# Patient Record
Sex: Female | Born: 1980 | Race: White | Hispanic: No | Smoking: Current every day smoker
Health system: Southern US, Community
[De-identification: ages and names within clinical notes are randomized; demographics above are authoritative.]

## PROBLEM LIST (undated history)

## (undated) DIAGNOSIS — F1910 Other psychoactive substance abuse, uncomplicated: Secondary | ICD-10-CM

## (undated) DIAGNOSIS — C801 Malignant (primary) neoplasm, unspecified: Secondary | ICD-10-CM

## (undated) HISTORY — PX: OTHER SURGICAL HISTORY: SHX169

---

## 2009-08-18 ENCOUNTER — Emergency Department (HOSPITAL_COMMUNITY): Admission: EM | Admit: 2009-08-18 | Discharge: 2009-08-18 | Payer: Self-pay | Admitting: Emergency Medicine

## 2009-09-13 ENCOUNTER — Emergency Department (HOSPITAL_COMMUNITY): Admission: EM | Admit: 2009-09-13 | Discharge: 2009-09-13 | Payer: Self-pay | Admitting: Emergency Medicine

## 2010-02-17 ENCOUNTER — Ambulatory Visit (HOSPITAL_COMMUNITY): Admission: RE | Admit: 2010-02-17 | Discharge: 2010-02-17 | Payer: Self-pay | Admitting: Obstetrics & Gynecology

## 2010-03-23 ENCOUNTER — Emergency Department (HOSPITAL_COMMUNITY): Admission: EM | Admit: 2010-03-23 | Discharge: 2010-03-24 | Payer: Self-pay | Admitting: Emergency Medicine

## 2010-03-25 ENCOUNTER — Emergency Department (HOSPITAL_COMMUNITY): Admission: EM | Admit: 2010-03-25 | Discharge: 2010-03-25 | Payer: Self-pay | Admitting: Emergency Medicine

## 2010-04-13 ENCOUNTER — Emergency Department (HOSPITAL_COMMUNITY): Admission: EM | Admit: 2010-04-13 | Discharge: 2010-04-13 | Payer: Self-pay | Admitting: Emergency Medicine

## 2011-01-17 LAB — DIFFERENTIAL
Basophils Absolute: 0 10*3/uL (ref 0.0–0.1)
Basophils Relative: 0 % (ref 0–1)
Eosinophils Absolute: 0.4 10*3/uL (ref 0.0–0.7)
Eosinophils Relative: 4 % (ref 0–5)
Lymphocytes Relative: 23 % (ref 12–46)
Monocytes Absolute: 0.5 10*3/uL (ref 0.1–1.0)
Monocytes Relative: 6 % (ref 3–12)

## 2011-01-17 LAB — COMPREHENSIVE METABOLIC PANEL
AST: 18 U/L (ref 0–37)
Albumin: 3.3 g/dL — ABNORMAL LOW (ref 3.5–5.2)
CO2: 27 mEq/L (ref 19–32)
Calcium: 8.6 mg/dL (ref 8.4–10.5)
Chloride: 103 mEq/L (ref 96–112)
Creatinine, Ser: 0.5 mg/dL (ref 0.4–1.2)
GFR calc Af Amer: >60 mL/min (ref >60)
GFR calc non Af Amer: >60 mL/min (ref >60)
Sodium: 137 mEq/L (ref 135–145)

## 2011-01-17 LAB — CBC
HCT: 30.8 % — ABNORMAL LOW (ref 36.0–46.0)
Hemoglobin: 10.8 g/dL — ABNORMAL LOW (ref 12.0–15.0)
Platelets: 322 10*3/uL (ref 150–400)
RBC: 3.58 MIL/uL — ABNORMAL LOW (ref 3.87–5.11)

## 2011-01-17 LAB — URINALYSIS, ROUTINE W REFLEX MICROSCOPIC
Bilirubin Urine: NEGATIVE
Hgb urine dipstick: NEGATIVE
Ketones, ur: NEGATIVE mg/dL
Nitrite: NEGATIVE
Protein, ur: NEGATIVE mg/dL
Specific Gravity, Urine: >1.03 — ABNORMAL HIGH (ref 1.005–1.030)
Urobilinogen, UA: 0.2 mg/dL (ref 0.0–1.0)
Urobilinogen, UA: 1 mg/dL (ref 0.0–1.0)
pH: 6.5 (ref 5.0–8.0)

## 2011-01-17 LAB — LIPASE, BLOOD: Lipase: 21 U/L (ref 11–59)

## 2011-01-17 LAB — URINE MICROSCOPIC-ADD ON

## 2011-01-17 LAB — PREGNANCY, URINE: Preg Test, Ur: NEGATIVE

## 2011-01-18 LAB — COMPREHENSIVE METABOLIC PANEL
ALT: 11 U/L (ref 0–35)
Albumin: 3.6 g/dL (ref 3.5–5.2)
Alkaline Phosphatase: 94 U/L (ref 39–117)
BUN: 5 mg/dL — ABNORMAL LOW (ref 6–23)
Calcium: 9.1 mg/dL (ref 8.4–10.5)
Creatinine, Ser: 0.61 mg/dL (ref 0.4–1.2)
GFR calc Af Amer: >60 mL/min (ref >60)
Glucose, Bld: 74 mg/dL (ref 70–99)
Sodium: 132 mEq/L — ABNORMAL LOW (ref 135–145)
Total Protein: 6.7 g/dL (ref 6.0–8.3)

## 2011-01-18 LAB — CBC
Hemoglobin: 12.1 g/dL (ref 12.0–15.0)
MCV: 87.3 fL (ref 78.0–100.0)
Platelets: 290 10*3/uL (ref 150–400)
RBC: 3.95 MIL/uL (ref 3.87–5.11)
RDW: 14.2 % (ref 11.5–15.5)

## 2011-01-18 LAB — URINALYSIS, ROUTINE W REFLEX MICROSCOPIC
Glucose, UA: NEGATIVE mg/dL
Ketones, ur: NEGATIVE mg/dL
Nitrite: NEGATIVE
Specific Gravity, Urine: 1.01 (ref 1.005–1.030)
Urobilinogen, UA: 0.2 mg/dL (ref 0.0–1.0)

## 2011-02-03 LAB — CBC
HCT: 31.6 % — ABNORMAL LOW (ref 36.0–46.0)
Hemoglobin: 11 g/dL — ABNORMAL LOW (ref 12.0–15.0)
MCV: 89 fL (ref 78.0–100.0)
Platelets: 228 10*3/uL (ref 150–400)
RBC: 3.55 MIL/uL — ABNORMAL LOW (ref 3.87–5.11)
RDW: 14.5 % (ref 11.5–15.5)
WBC: 6.9 10*3/uL (ref 4.0–10.5)

## 2011-02-03 LAB — BASIC METABOLIC PANEL
BUN: 8 mg/dL (ref 6–23)
Chloride: 105 mEq/L (ref 96–112)
GFR calc non Af Amer: >60 mL/min (ref >60)
Sodium: 137 mEq/L (ref 135–145)

## 2011-02-03 LAB — DIFFERENTIAL
Lymphocytes Relative: 33 % (ref 12–46)
Lymphs Abs: 2.3 10*3/uL (ref 0.7–4.0)
Monocytes Relative: 6 % (ref 3–12)
Neutro Abs: 3.8 10*3/uL (ref 1.7–7.7)
Neutrophils Relative %: 55 % (ref 43–77)

## 2011-02-03 LAB — URINALYSIS, ROUTINE W REFLEX MICROSCOPIC
Bilirubin Urine: NEGATIVE
Glucose, UA: NEGATIVE mg/dL
Ketones, ur: NEGATIVE mg/dL
Nitrite: NEGATIVE
Urobilinogen, UA: 0.2 mg/dL (ref 0.0–1.0)

## 2011-02-03 LAB — PREGNANCY, URINE: Preg Test, Ur: NEGATIVE

## 2011-04-23 ENCOUNTER — Emergency Department (HOSPITAL_COMMUNITY)
Admission: EM | Admit: 2011-04-23 | Discharge: 2011-04-23 | Disposition: A | Discharge status: Home / Self Care | Payer: Medicaid Other | Attending: Emergency Medicine | Admitting: Emergency Medicine

## 2011-04-23 DIAGNOSIS — J3489 Other specified disorders of nose and nasal sinuses: Secondary | ICD-10-CM | POA: Insufficient documentation

## 2011-04-23 DIAGNOSIS — R32 Unspecified urinary incontinence: Secondary | ICD-10-CM | POA: Insufficient documentation

## 2011-04-23 DIAGNOSIS — F411 Generalized anxiety disorder: Secondary | ICD-10-CM | POA: Insufficient documentation

## 2011-04-23 DIAGNOSIS — F3289 Other specified depressive episodes: Secondary | ICD-10-CM | POA: Insufficient documentation

## 2011-04-23 DIAGNOSIS — R05 Cough: Secondary | ICD-10-CM | POA: Insufficient documentation

## 2011-04-23 DIAGNOSIS — R059 Cough, unspecified: Secondary | ICD-10-CM | POA: Insufficient documentation

## 2011-04-23 DIAGNOSIS — F329 Major depressive disorder, single episode, unspecified: Secondary | ICD-10-CM | POA: Insufficient documentation

## 2011-04-23 DIAGNOSIS — O99891 Other specified diseases and conditions complicating pregnancy: Secondary | ICD-10-CM | POA: Insufficient documentation

## 2013-07-24 ENCOUNTER — Emergency Department (HOSPITAL_COMMUNITY)
Admission: EM | Admit: 2013-07-24 | Discharge: 2013-07-24 | Disposition: A | Discharge status: Home / Self Care | Payer: Medicaid Other | Attending: Emergency Medicine | Admitting: Emergency Medicine

## 2013-07-24 ENCOUNTER — Encounter (HOSPITAL_COMMUNITY): Payer: Self-pay | Admitting: Emergency Medicine

## 2013-07-24 DIAGNOSIS — Z3202 Encounter for pregnancy test, result negative: Secondary | ICD-10-CM | POA: Insufficient documentation

## 2013-07-24 DIAGNOSIS — M542 Cervicalgia: Secondary | ICD-10-CM | POA: Insufficient documentation

## 2013-07-24 DIAGNOSIS — F17200 Nicotine dependence, unspecified, uncomplicated: Secondary | ICD-10-CM | POA: Insufficient documentation

## 2013-07-24 DIAGNOSIS — R591 Generalized enlarged lymph nodes: Secondary | ICD-10-CM

## 2013-07-24 DIAGNOSIS — IMO0001 Reserved for inherently not codable concepts without codable children: Secondary | ICD-10-CM | POA: Insufficient documentation

## 2013-07-24 DIAGNOSIS — R599 Enlarged lymph nodes, unspecified: Secondary | ICD-10-CM | POA: Insufficient documentation

## 2013-07-24 LAB — URINALYSIS, ROUTINE W REFLEX MICROSCOPIC
Bilirubin Urine: NEGATIVE
Glucose, UA: NEGATIVE mg/dL
Ketones, ur: NEGATIVE mg/dL
Leukocytes, UA: NEGATIVE

## 2013-07-24 LAB — POCT PREGNANCY, URINE: Preg Test, Ur: NEGATIVE

## 2013-07-24 MED ORDER — TRAMADOL HCL 50 MG PO TABS
50.0000 mg | ORAL_TABLET | Freq: Once | ORAL | Status: AC
  Administered 2013-07-24: 50 mg via ORAL
  Filled 2013-07-24: qty 1

## 2013-07-24 MED ORDER — DOXYCYCLINE HYCLATE 100 MG PO CAPS: 100.0000 mg | ORAL_CAPSULE | Freq: Two times a day (BID) | ORAL | Status: DC

## 2013-07-24 MED ORDER — TRAMADOL HCL 50 MG PO TABS: 50.0000 mg | ORAL_TABLET | Freq: Four times a day (QID) | ORAL | Status: DC | PRN

## 2013-07-24 MED ORDER — DOXYCYCLINE HYCLATE 100 MG PO TABS
100.0000 mg | ORAL_TABLET | Freq: Once | ORAL | Status: AC
  Administered 2013-07-24: 100 mg via ORAL
  Filled 2013-07-24: qty 1

## 2013-07-24 NOTE — ED Notes (Signed)
Pt c/o knot to the rt neck and rt groin x 1.5 weeks

## 2013-07-24 NOTE — ED Provider Notes (Signed)
CSN: [id number]     Arrival date & time 07/24/13  2119 History   First MD Initiated Contact with Patient 07/24/13 2156     Chief Complaint  Patient presents with  . Groin Pain  . Neck Pain   (Consider location/radiation/quality/duration/timing/severity/associated sxs/prior Treatment) HPI Comments: Wendy Mathews is a 32 y.o. female who presents to the Emergency Department complaining of pain and swollen nodules to the right neck and right groin for 2 weeks. Patient she states she noticed symptoms after having a tattoo to the back of her neck and her right buttock.   She denies redness, abscess, numbness or weakness of the lower extremities, neck stiffness, headache, fever or chills  The history is provided by the patient.    History reviewed. No pertinent past medical history. Past Surgical History  Procedure Laterality Date  . Conilization     History reviewed. No pertinent family history. History  Substance Use Topics  . Smoking status: Current Every Day Smoker    Types: Cigarettes  . Smokeless tobacco: Not on file  . Alcohol Use: No   OB History   Grav Para Term Preterm Abortions TAB SAB Ect Mult Living                 Review of Systems  Constitutional: Negative for fever and chills.  HENT: Positive for neck pain. Negative for facial swelling and neck stiffness.   Genitourinary: Negative for dysuria, frequency, flank pain, vaginal bleeding, vaginal discharge, difficulty urinating and pelvic pain.  Musculoskeletal: Positive for myalgias. Negative for joint swelling and arthralgias.  Skin: Negative for color change, rash and wound.       Two recent tattoos  All other systems reviewed and are negative.    Allergies  Aspirin  Home Medications  No current outpatient prescriptions on file. BP 124/63  Pulse 83  Temp(Src) 98.1 F (36.7 C) (Oral)  Resp 20  Ht 5\' 1"  (1.549 m)  Wt 122 lb (55.339 kg)  BMI 23.06 kg/m2  SpO2 100%  LMP 06/26/2013 Physical Exam   Nursing note and vitals reviewed. Constitutional: She is oriented to person, place, and time. She appears well-developed and well-nourished. No distress.  HENT:  Head: Normocephalic and atraumatic.  Mouth/Throat: Oropharynx is clear and moist.  Neck: Normal range of motion. Neck supple.  New tattoo to the posterior neck. No surrounding erythema or abscesses.  Cardiovascular: Normal rate, regular rhythm, normal heart sounds and intact distal pulses.   No murmur heard. Pulmonary/Chest: Effort normal and breath sounds normal. No respiratory distress.  Abdominal: Soft. She exhibits no distension. There is no tenderness. There is no rebound and no guarding.  Musculoskeletal: She exhibits no edema and no tenderness.  Lymphadenopathy:    She has cervical adenopathy.       Right cervical: Superficial cervical and posterior cervical adenopathy present.       Left cervical: No superficial cervical and no posterior cervical adenopathy present.       Right: Inguinal adenopathy present.       Left: No inguinal adenopathy present.  Neurological: She is alert and oriented to person, place, and time. She exhibits normal muscle tone. Coordination normal.  Skin: Skin is warm. Rash noted. There is erythema.  Large, new tattoo to the entire right buttock.  No papules, abscesses or surrounding erythema. Patient has full range of motion of the right hip. DP pulse is brisk, distal sensation intact.    ED Course  Procedures (including critical care time) Labs  Review Results for orders placed during the hospital encounter of 07/24/13  URINALYSIS, ROUTINE W REFLEX MICROSCOPIC      Result Value Range   Color, Urine YELLOW  YELLOW   APPearance CLEAR  CLEAR   Specific Gravity, Urine 1.025  1.005 - 1.030   pH 6.0  5.0 - 8.0   Glucose, UA NEGATIVE  NEGATIVE mg/dL   Hgb urine dipstick NEGATIVE  NEGATIVE   Bilirubin Urine NEGATIVE  NEGATIVE   Ketones, ur NEGATIVE  NEGATIVE mg/dL   Protein, ur NEGATIVE   NEGATIVE mg/dL   Urobilinogen, UA 0.2  0.0 - 1.0 mg/dL   Nitrite NEGATIVE  NEGATIVE   Leukocytes, UA NEGATIVE  NEGATIVE  POCT PREGNANCY, URINE      Result Value Range   Preg Test, Ur NEGATIVE  NEGATIVE    Imaging Review No results found.  MDM    Vital signs are stable. Patient is well appearing. Ambulatory, no focal neuro deficits on exam no meningeal signs. Right sided lymphadenopathy is likely due to recent tattoos. Patient agrees to antibiotic as directed and close followup with her primary care physician for recheck. She appears stable for discharge at this time.  Caddie Randle L. Telesia Ates, PA-C 07/24/13 2349

## 2013-07-25 NOTE — ED Provider Notes (Signed)
Medical screening examination/treatment/procedure(s) were performed by non-physician practitioner and as supervising physician I was immediately available for consultation/collaboration.   Shaletta Hinostroza L Vyctoria Dickman, MD 07/25/13 1415 

## 2015-02-18 ENCOUNTER — Emergency Department (HOSPITAL_COMMUNITY)
Admission: EM | Admit: 2015-02-18 | Discharge: 2015-02-19 | Disposition: A | Discharge status: Home / Self Care | Payer: Medicaid Other | Attending: Emergency Medicine | Admitting: Emergency Medicine

## 2015-02-18 ENCOUNTER — Encounter (HOSPITAL_COMMUNITY): Payer: Self-pay | Admitting: Emergency Medicine

## 2015-02-18 DIAGNOSIS — Z720 Tobacco use: Secondary | ICD-10-CM | POA: Diagnosis not present

## 2015-02-18 DIAGNOSIS — Z79899 Other long term (current) drug therapy: Secondary | ICD-10-CM | POA: Insufficient documentation

## 2015-02-18 DIAGNOSIS — R21 Rash and other nonspecific skin eruption: Secondary | ICD-10-CM | POA: Diagnosis present

## 2015-02-18 MED ORDER — PREDNISONE 10 MG PO TABS: 10.0000 mg | ORAL_TABLET | Freq: Every day | ORAL | Status: DC

## 2015-02-18 MED ORDER — DIPHENHYDRAMINE HCL 25 MG PO CAPS
50.0000 mg | ORAL_CAPSULE | Freq: Once | ORAL | Status: AC
  Administered 2015-02-18: 50 mg via ORAL
  Filled 2015-02-18: qty 2

## 2015-02-18 MED ORDER — PERMETHRIN 5 % EX CREA: TOPICAL_CREAM | CUTANEOUS | Status: DC

## 2015-02-18 MED ORDER — PREDNISONE 50 MG PO TABS
60.0000 mg | ORAL_TABLET | Freq: Once | ORAL | Status: AC
  Administered 2015-02-18: 60 mg via ORAL
  Filled 2015-02-18 (×2): qty 1

## 2015-02-18 NOTE — ED Notes (Signed)
Patient states she thinks her daughter may have scabies and is now breaking into a rash.

## 2015-02-18 NOTE — Discharge Instructions (Signed)
Your rash may be related to recently changing your soap and detergent. I recommended you use Benadryl 50 mg every 8 hours as needed for itching. We are placing you on steroids. Your rash does not appear to be scabies but given your concern we are discharging him with prescription for permethrin. You will need to wash all of your towels, sheets, clothing in hot water. If your symptoms worsen I recommend you follow-up with her primary care physician.   Rash A rash is a change in the color or texture of your skin. There are many different types of rashes. You may have other problems that accompany your rash. CAUSES   Infections.  Allergic reactions. This can include allergies to pets or foods.  Certain medicines.  Exposure to certain chemicals, soaps, or cosmetics.  Heat.  Exposure to poisonous plants.  Tumors, both cancerous and noncancerous. SYMPTOMS   Redness.  Scaly skin.  Itchy skin.  Dry or cracked skin.  Bumps.  Blisters.  Pain. DIAGNOSIS  Your caregiver may do a physical exam to determine what type of rash you have. A skin sample (biopsy) may be taken and examined under a microscope. TREATMENT  Treatment depends on the type of rash you have. Your caregiver may prescribe certain medicines. For serious conditions, you may need to see a skin doctor (dermatologist). HOME CARE INSTRUCTIONS   Avoid the substance that caused your rash.  Do not scratch your rash. This can cause infection.  You may take cool baths to help stop itching.  Only take over-the-counter or prescription medicines as directed by your caregiver.  Keep all follow-up appointments as directed by your caregiver. SEEK IMMEDIATE MEDICAL CARE IF:  You have increasing pain, swelling, or redness.  You have a fever.  You have new or severe symptoms.  You have body aches, diarrhea, or vomiting.  Your rash is not better after 3 days. MAKE SURE YOU:  Understand these instructions.  Will watch  your condition.  Will get help right away if you are not doing well or get worse. Document Released: 10/07/2002 Document Revised: 01/09/2012 Document Reviewed: 08/01/2011 [facility] Patient Information 2015 [place], [place]. This information is not intended to replace advice given to you by your health care provider. Make sure you discuss any questions you have with your health care provider.   Possible Scabies Scabies are small bugs (mites) that burrow under the skin and cause red bumps and severe itching. These bugs can only be seen with a microscope. Scabies are highly contagious. They can spread easily from person to person by direct contact. They are also spread through sharing clothing or linens that have the scabies mites living in them. It is not unusual for an entire family to become infected through shared towels, clothing, or bedding.  HOME CARE INSTRUCTIONS   Your caregiver may prescribe a cream or lotion to kill the mites. If cream is prescribed, massage the cream into the entire body from the neck to the bottom of both feet. Also massage the cream into the scalp and face if your child is less than 49 year old. Avoid the eyes and mouth. Do not wash your hands after application.  Leave the cream on for 8 to 12 hours. Your child should bathe or shower after the 8 to 12 hour application period. Sometimes it is helpful to apply the cream to your child right before bedtime.  One treatment is usually effective and will eliminate approximately 95% of infestations. For severe cases, your caregiver  may decide to repeat the treatment in 1 week. Everyone in your household should be treated with one application of the cream.  New rashes or burrows should not appear within 24 to 48 hours after successful treatment. However, the itching and rash may last for 2 to 4 weeks after successful treatment. Your caregiver may prescribe a medicine to help with the itching or to help the rash go away more  quickly.  Scabies can live on clothing or linens for up to 3 days. All of your child's recently used clothing, towels, stuffed toys, and bed linens should be washed in hot water and then dried in a dryer for at least 20 minutes on high heat. Items that cannot be washed should be enclosed in a plastic bag for at least 3 days.  To help relieve itching, bathe your child in a cool bath or apply cool washcloths to the affected areas.  Your child may return to school after treatment with the prescribed cream. SEEK MEDICAL CARE IF:   The itching persists longer than 4 weeks after treatment.  The rash spreads or becomes infected. Signs of infection include red blisters or yellow-tan crust. Document Released: 10/17/2005 Document Revised: 01/09/2012 Document Reviewed: 02/25/2009 [facility] Patient Information 2015 [place], [place]. This information is not intended to replace advice given to you by your health care provider. Make sure you discuss any questions you have with your health care provider.

## 2015-02-18 NOTE — ED Provider Notes (Signed)
TIME SEEN: 11:35 PM  CHIEF COMPLAINT: Rash  HPI: Pt is a 34 y.o. female with a significant past medical history who presents emergency department with a rash that started today. States her daughter has had similar symptoms. She reports the rash is pruritic. States she has been using a new soap and laundry detergent recently but also recently acquired a mattress from a friend. Denies any fevers. No other systemic symptoms. No vomiting or diarrhea. No known tick bite. No recent travel.  ROS: See HPI Constitutional: no fever  Eyes: no drainage  ENT: no runny nose   Cardiovascular:  no chest pain  Resp: no SOB  GI: no vomiting GU: no dysuria Integumentary:  rash  Allergy:  hives  Musculoskeletal: no leg swelling  Neurological: no slurred speech ROS otherwise negative  PAST MEDICAL HISTORY/PAST SURGICAL HISTORY:  History reviewed. No pertinent past medical history.  MEDICATIONS:  Prior to Admission medications   Medication Sig Start Date End Date Taking? Authorizing Provider  doxycycline (VIBRAMYCIN) 100 MG capsule Take 1 capsule (100 mg total) by mouth 2 (two) times daily. For 10 days 07/24/13   Tammy Triplett, PA-C  traMADol (ULTRAM) 50 MG tablet Take 1 tablet (50 mg total) by mouth every 6 (six) hours as needed for pain. 07/24/13   Tammy Triplett, PA-C    ALLERGIES:  Allergies  Allergen Reactions  . Aspirin     SOCIAL HISTORY:  History  Substance Use Topics  . Smoking status: Current Every Day Smoker    Types: Cigarettes  . Smokeless tobacco: Not on file  . Alcohol Use: No    FAMILY HISTORY: No family history on file.  EXAM: BP 122/82 mmHg  Pulse 81  Temp(Src) 98.6 F (37 C) (Oral)  Resp 20  Ht 5' (1.524 m)  Wt 113 lb (51.256 kg)  BMI 22.07 kg/m2  SpO2 100%  LMP 02/04/2015 CONSTITUTIONAL: Alert and oriented and responds appropriately to questions. Well-appearing; well-nourished, hydrated, nontoxic HEAD: Normocephalic EYES: Conjunctivae clear, PERRL ENT:  normal nose; no rhinorrhea; moist mucous membranes; pharynx without lesions noted poor dentition, no angioedema, normal phonation NECK: Supple, no meningismus, no LAD  CARD: RRR; S1 and S2 appreciated; no murmurs, no clicks, no rubs, no gallops RESP: Normal chest excursion without splinting or tachypnea; breath sounds clear and equal bilaterally; no wheezes, no rhonchi, no rales, no hypoxia or respiratory distress, speaking full sentences ABD/GI: Normal bowel sounds; non-distended; soft, non-tender, no rebound, no guarding BACK:  The back appears normal and is non-tender to palpation, there is no CVA tenderness EXT: Normal ROM in all joints; non-tender to palpation; no edema; normal capillary refill; no cyanosis    SKIN: Normal color for age and race; warm; patient has multiple scattered macular lesions and some areas that look like hives to her torso and extremities, no lesions in between her digits, no blisters or desquamation, no rash on the palms or soles or mucous membranes, no petechiae or purpura NEURO: Moves all extremities equally PSYCH: The patient's mood and manner are appropriate. Grooming and personal hygiene are appropriate.  MEDICAL DECISION MAKING: Patient here with rash. Rash appears consistent with some type of insect bite - discussed with patient that this could be bedbugs from recently receiving a mattress from a friend. I have lower suspicion that this is scabies but given their concern will discharge with permethrin. Have advised her to use Benadryl over-the-counter for itching. I will also put the patient on prednisone given some of these lesions look like hives.  No other systemic symptoms. Also discussed with patient I recommend that she go back to using the old laundry detergent and soap. No sign of any life-threatening rash on exam. Discussed return precautions. She verbalizes understanding and is comfortable with plan.     [place], DO 02/19/15 [phone]

## 2015-04-26 ENCOUNTER — Emergency Department (HOSPITAL_COMMUNITY)
Admission: EM | Admit: 2015-04-26 | Discharge: 2015-04-26 | Disposition: A | Discharge status: Home / Self Care | Payer: Medicaid Other | Attending: Emergency Medicine | Admitting: Emergency Medicine

## 2015-04-26 ENCOUNTER — Encounter (HOSPITAL_COMMUNITY): Payer: Self-pay | Admitting: Emergency Medicine

## 2015-04-26 DIAGNOSIS — Z7952 Long term (current) use of systemic steroids: Secondary | ICD-10-CM | POA: Insufficient documentation

## 2015-04-26 DIAGNOSIS — R21 Rash and other nonspecific skin eruption: Secondary | ICD-10-CM | POA: Insufficient documentation

## 2015-04-26 DIAGNOSIS — Z792 Long term (current) use of antibiotics: Secondary | ICD-10-CM | POA: Diagnosis not present

## 2015-04-26 DIAGNOSIS — Z720 Tobacco use: Secondary | ICD-10-CM | POA: Diagnosis not present

## 2015-04-26 MED ORDER — PREDNISONE 10 MG PO TABS: 10.0000 mg | ORAL_TABLET | Freq: Every day | ORAL | Status: DC

## 2015-04-26 NOTE — ED Provider Notes (Signed)
TIME SEEN: 8:53 AM   CHIEF COMPLAINT: Rash   HPI: Wendy Mathews is a 34 y.o. female who presents to the Emergency Department complaining of a constant, moderate, pruritic rash to bilateral arms and legs and abdomen onset 2 days. She denies using any new or different hygiene products but states she has gotten a new bed recently. She also reports her daughter has similar symptoms. She notes the prescription for steroids she received in April for the same complaint relieved her rash symptoms 2 months ago. She has used topical anti-itch medication but states this has worsened the rash. No fever.  No other new medications.  She is also requesting a note for work today.    ROS: See HPI Constitutional: no fever  Eyes: no drainage  ENT: no runny nose   Cardiovascular:  no chest pain  Resp: no SOB  GI: no vomiting GU: no dysuria Integumentary: rash  Allergy: no hives  Musculoskeletal: no leg swelling  Neurological: no slurred speech ROS otherwise negative  PAST MEDICAL HISTORY/PAST SURGICAL HISTORY:  History reviewed. No pertinent past medical history.  MEDICATIONS:  Prior to Admission medications   Medication Sig Start Date End Date Taking? Authorizing Provider  doxycycline (VIBRAMYCIN) 100 MG capsule Take 1 capsule (100 mg total) by mouth 2 (two) times daily. For 10 days 07/24/13   Tammy Triplett, PA-C  permethrin (ELIMITE) 5 % cream Apply to affected area once 02/18/15   Asencion Loveday N Terik Haughey, DO  predniSONE (DELTASONE) 10 MG tablet Take 1 tablet (10 mg total) by mouth daily. Take 60 mg for 2 days then 50 mg for 2 days then 40 mg for 2 days then 30 mg for 2 days then 20 mg for 2 days then 10 mg for 2 days. 02/18/15   Jarelle Ates N Oshae Simmering, DO  traMADol (ULTRAM) 50 MG tablet Take 1 tablet (50 mg total) by mouth every 6 (six) hours as needed for pain. 07/24/13   Tammy Triplett, PA-C    ALLERGIES:  Allergies  Allergen Reactions  . Aspirin     SOCIAL HISTORY:  History  Substance Use Topics  .  Smoking status: Current Every Day Smoker -- 1.00 packs/day for 13 years    Types: Cigarettes  . Smokeless tobacco: Never Used  . Alcohol Use: No    FAMILY HISTORY: Family History  Problem Relation Age of Onset  . Diabetes Other     EXAM: BP 122/97 mmHg  Pulse 76  Temp(Src) 98.3 F (36.8 C) (Oral)  Resp 18  Ht 5' (1.524 m)  Wt 111 lb (50.349 kg)  BMI 21.68 kg/m2  SpO2 100%  LMP 04/01/2015 CONSTITUTIONAL: Alert and oriented and responds appropriately to questions. Well-appearing; well-nourished; non toxic, afebrile  HEAD: Normocephalic EYES: Conjunctivae clear, PERRL ENT: normal nose; no rhinorrhea; moist mucous membranes; pharynx without lesions noted NECK: Supple, no meningismus, no LAD  CARD: RRR; S1 and S2 appreciated; no murmurs, no clicks, no rubs, no gallops RESP: Normal chest excursion without splinting or tachypnea; breath sounds clear and equal bilaterally; no wheezes, no rhonchi, no rales, no hypoxia or respiratory distress, speaking full sentences ABD/GI: Normal bowel sounds; non-distended; soft, non-tender, no rebound, no guarding, no peritoneal signs BACK:  The back appears normal and is non-tender to palpation, there is no CVA tenderness EXT: Normal ROM in all joints; non-tender to palpation; no edema; normal capillary refill; no cyanosis, no calf tenderness or swelling    SKIN: Normal color for age and race; warm; diffuse papular rash with  some small areas of surrounding erythema but no induration or fluctuance, no drainage, no hives, no petechiae or purpura, no lesions between the digits, no lesions on the palms or soles or in the mucous membranes, no blisters or desquamation NEURO: Moves all extremities equally, sensation to light touch intact diffusely, cranial nerves II through XII intact PSYCH: The patient's mood and manner are appropriate. Grooming and personal hygiene are appropriate.  MEDICAL DECISION MAKING: Patient here with a rash that appears to be  consistent with insect bites, possible bedbugs or fleas. Nothing to suggest scabies. No signs of a life-threatening rash. She has had similar symptoms in April and reports a steroid taper helped her symptoms significantly. We will provide prednisone prescription today, work note. Have discussed using Benadryl for symptoms. Discussed that patient will need to wash all of her clothes, towels, etc. and may also need a new mattress. Recommended outpatient follow-up with the health department.    I personally performed the services described in this documentation, which was scribed in my presence. The recorded information has been reviewed and is accurate.    [place], DO 04/26/15 [phone]

## 2015-04-26 NOTE — ED Notes (Signed)
MD at bedside. 

## 2015-04-26 NOTE — Discharge Instructions (Signed)
Please use Benadryl 50 mg every 8 hours as needed for itching.   Bedbugs Bedbugs are tiny bugs that live in and around beds. During the day, they hide in mattresses and other places near beds. They come out at night and bite people lying in bed. They need blood to live and grow. Bedbugs can be found in beds anywhere. Usually, they are found in places where many people come and go (hotels, shelters, hospitals). It does not matter whether the place is dirty or clean. Getting bitten by bedbugs rarely causes a medical problem. The biggest problem can be getting rid of them. This often takes the work of a Financial risk analyst. CAUSES  Less use of pesticides. Bedbugs were common before the 1950s. Then, strong pesticides such as DDT nearly wiped them out. Today, these pesticides are not used because they harm the environment and can cause health problems.  More travel. Besides mattresses, bedbugs can also live in clothing and luggage. They can come along as people travel from place to place. Bedbugs are more common in certain parts of the world. When people travel to those areas, the bugs can come home with them.  Presence of birds and bats. Bedbugs often infest birds and bats. If you have these animals in or near your home, bedbugs may infest your house, too. SYMPTOMS It does not hurt to be bitten by a bedbug. You will probably not wake up when you are bitten. Bedbugs usually bite areas of the skin that are not covered. Symptoms may show when you wake up, or they may take a day or more to show up. Symptoms may include:  Small red bumps on the skin. These might be lined up in a row or clustered in a group.  A darker red dot in the middle of red bumps.  Blisters on the skin. There may be swelling and very bad itching. These may be signs of an allergic reaction. This does not happen often. DIAGNOSIS Bedbug bites might look and feel like other types of insect bites. The bugs do not stay on the body like  ticks or lice. They bite, drop off, and crawl away to hide. Your caregiver will probably:  Ask about your symptoms.  Ask about your recent activities and travel.  Check your skin for bedbug bites.  Ask you to check at home for signs of bedbugs. You should look for:  Spots or stains on the bed or nearby. This could be from bedbugs that were crushed or from their eggs or waste.  Bedbugs themselves. They are reddish-brown, oval, and flat. They do not fly. They are about the size of an apple seed.  Places to look for bedbugs include:  Beds. Check mattresses, headboards, box springs, and bed frames.  On drapes and curtains near the bed.  Under carpeting in the bedroom.  Behind electrical outlets.  Behind any wallpaper that is peeling.  Inside luggage. TREATMENT Most bedbug bites do not need treatment. They usually go away on their own in a few days. The bites are not dangerous. However, treatment may be needed if you have scratched so much that your skin has become infected. You may also need treatment if you are allergic to bedbug bites. Treatment options include:  A drug that stops swelling and itching (corticosteroid). Usually, a cream is rubbed on the skin. If you have a bad rash, you may be given a corticosteroid pill.  Oral antihistamines. These are pills to help control itching.  Antibiotic medicines. An antibiotic may be prescribed for infected skin. HOME CARE INSTRUCTIONS   Take any medicine prescribed by your caregiver for your bites. Follow the directions carefully.  Consider wearing pajamas with long sleeves and pant legs.  Your bedroom may need to be treated. A pest control expert should make sure the bedbugs are gone. You may need to throw away mattresses or luggage. Ask the pest control expert what you can do to keep the bedbugs from coming back. Common suggestions include:  Putting a plastic cover over your mattress.  Washing and drying your clothes and bedding  in hot water and a hot dryer. The temperature should be hotter than 120 F (48.9 C). Bedbugs are killed by high temperatures.  Vacuuming carefully all around your bed. Vacuum in all cracks and crevices where the bugs might hide. Do this often.  Carefully checking all used furniture, bedding, or clothes that you bring into your house.  Eliminating bird nests and bat roosts.  If you get bedbug bites when traveling, check all your possessions carefully before bringing them into your house. If you find any bugs on clothes or in your luggage, consider throwing those items away. SEEK MEDICAL CARE IF:  You have red bug bites that keep coming back.  You have red bug bites that itch badly.  You have bug bites that cause a skin rash.  You have scratch marks that are red and sore. SEEK IMMEDIATE MEDICAL CARE IF: You have a fever. Document Released: 11/19/2010 Document Revised: 01/09/2012 Document Reviewed: 11/19/2010 [facility] Patient Information 2015 [place], [place]. This information is not intended to replace advice given to you by your health care provider. Make sure you discuss any questions you have with your health care provider.   Rash A rash is a change in the color or texture of your skin. There are many different types of rashes. You may have other problems that accompany your rash. CAUSES   Infections.  Allergic reactions. This can include allergies to pets or foods.  Certain medicines.  Exposure to certain chemicals, soaps, or cosmetics.  Heat.  Exposure to poisonous plants.  Tumors, both cancerous and noncancerous. SYMPTOMS   Redness.  Scaly skin.  Itchy skin.  Dry or cracked skin.  Bumps.  Blisters.  Pain. DIAGNOSIS  Your caregiver may do a physical exam to determine what type of rash you have. A skin sample (biopsy) may be taken and examined under a microscope. TREATMENT  Treatment depends on the type of rash you have. Your caregiver may prescribe  certain medicines. For serious conditions, you may need to see a skin doctor (dermatologist). HOME CARE INSTRUCTIONS   Avoid the substance that caused your rash.  Do not scratch your rash. This can cause infection.  You may take cool baths to help stop itching.  Only take over-the-counter or prescription medicines as directed by your caregiver.  Keep all follow-up appointments as directed by your caregiver. SEEK IMMEDIATE MEDICAL CARE IF:  You have increasing pain, swelling, or redness.  You have a fever.  You have new or severe symptoms.  You have body aches, diarrhea, or vomiting.  Your rash is not better after 3 days. MAKE SURE YOU:  Understand these instructions.  Will watch your condition.  Will get help right away if you are not doing well or get worse. Document Released: 10/07/2002 Document Revised: 01/09/2012 Document Reviewed: 08/01/2011 [facility] Patient Information 2015 [place], [place]. This information is not intended to replace advice given to you  by your health care provider. Make sure you discuss any questions you have with your health care provider. ° °

## 2015-04-26 NOTE — ED Notes (Signed)
Patient c/o rash to arms, leg, and abd that started 2 days ago. Per patient rash itches. Patient reports using anti-itch for mosquito bites and beg bugs but states that made it worse.

## 2015-12-06 ENCOUNTER — Encounter (HOSPITAL_COMMUNITY): Payer: Self-pay | Admitting: Emergency Medicine

## 2015-12-06 ENCOUNTER — Emergency Department (HOSPITAL_COMMUNITY)
Admission: EM | Admit: 2015-12-06 | Discharge: 2015-12-06 | Disposition: A | Discharge status: Home / Self Care | Payer: Medicaid Other | Attending: Emergency Medicine | Admitting: Emergency Medicine

## 2015-12-06 ENCOUNTER — Emergency Department (HOSPITAL_COMMUNITY): Payer: Medicaid Other

## 2015-12-06 DIAGNOSIS — R059 Cough, unspecified: Secondary | ICD-10-CM

## 2015-12-06 DIAGNOSIS — Z8541 Personal history of malignant neoplasm of cervix uteri: Secondary | ICD-10-CM | POA: Insufficient documentation

## 2015-12-06 DIAGNOSIS — R05 Cough: Secondary | ICD-10-CM | POA: Diagnosis present

## 2015-12-06 DIAGNOSIS — J069 Acute upper respiratory infection, unspecified: Secondary | ICD-10-CM | POA: Diagnosis not present

## 2015-12-06 DIAGNOSIS — Z8659 Personal history of other mental and behavioral disorders: Secondary | ICD-10-CM | POA: Insufficient documentation

## 2015-12-06 DIAGNOSIS — F17210 Nicotine dependence, cigarettes, uncomplicated: Secondary | ICD-10-CM | POA: Insufficient documentation

## 2015-12-06 DIAGNOSIS — Z79899 Other long term (current) drug therapy: Secondary | ICD-10-CM | POA: Diagnosis not present

## 2015-12-06 HISTORY — DX: Malignant (primary) neoplasm, unspecified: C80.1

## 2015-12-06 HISTORY — DX: Other psychoactive substance abuse, uncomplicated: F19.10

## 2015-12-06 MED ORDER — PREDNISONE 20 MG PO TABS: 40.0000 mg | ORAL_TABLET | Freq: Every day | ORAL | Status: DC

## 2015-12-06 MED ORDER — ALBUTEROL SULFATE HFA 108 (90 BASE) MCG/ACT IN AERS: 2.0000 | INHALATION_SPRAY | Freq: Four times a day (QID) | RESPIRATORY_TRACT | Status: AC

## 2015-12-06 NOTE — Discharge Instructions (Signed)

## 2015-12-06 NOTE — ED Notes (Signed)
PT c/o nasal congestion, congested non-productive cough and fever x4 days.

## 2015-12-06 NOTE — ED Provider Notes (Signed)
CSN: [id number]     Arrival date & time 12/06/15  1644 History  By signing my name below, I, Wendy Mathews, attest that this documentation has been prepared under the direction and in the presence of Vern Guerette, PA-C. Electronically Signed: Starleen Mathews ED Scribe. 12/06/2015. 5:31 PM.    Chief Complaint  Patient presents with  . Cough   The history is provided by the patient. No language interpreter was used.   HPI Comments: Wendy Mathews is a 35 y.o. female with no chronic conditions who presents to the Emergency Department complaining of an intermittent fever Tmax of 102.4 onset 4 days ago.  Associated symptoms include nasal congestion, dry cough, and chest tightness.  The patient has used Nyquil and Dayquil with some fever improvement (last dose 1 hour ago).  She denies short of breath, chest pain, abdominal pain, dysuria, vomiting or diarrhea.  Past Medical History  Diagnosis Date  . Substance abuse   . Cancer [facility])     cervical cancer   Past Surgical History  Procedure Laterality Date  . Conilization     Family History  Problem Relation Age of Onset  . Diabetes Other    Social History  Substance Use Topics  . Smoking status: Current Every Day Smoker -- 0.50 packs/day for 13 years    Types: Cigarettes  . Smokeless tobacco: Never Used  . Alcohol Use: No   OB History    Gravida Para Term Preterm AB TAB SAB Ectopic Multiple Living   6 6 6       6      Review of Systems  Constitutional: Positive for fever. Negative for chills, activity change and appetite change.  HENT: Positive for congestion, rhinorrhea and sore throat. Negative for facial swelling and trouble swallowing.   Eyes: Negative for visual disturbance.  Respiratory: Positive for cough. Negative for shortness of breath, wheezing and stridor.   Gastrointestinal: Negative for nausea and vomiting.  Musculoskeletal: Negative for neck pain and neck stiffness.  Skin: Negative.   Neurological: Negative for  dizziness, weakness, numbness and headaches.  Hematological: Negative for adenopathy.  Psychiatric/Behavioral: Negative for confusion.  All other systems reviewed and are negative.     Allergies  Aspirin  Home Medications   Prior to Admission medications   Medication Sig Start Date End Date Taking? Authorizing Provider  gabapentin (NEURONTIN) 300 MG capsule Take 300 mg by mouth 3 (three) times daily.   Yes Historical Provider, MD  Pseudoeph-Doxylamine-DM-APAP (DAYQUIL/NYQUIL COLD/FLU RELIEF PO) Take 1-2 capsules by mouth every 4 (four) hours as needed (for cold and cough symptoms).   Yes Historical Provider, MD   BP 110/95 mmHg  Pulse 93  Temp(Src) 98.4 F (36.9 C) (Oral)  Resp 16  Ht 5\' 1"  (1.549 m)  Wt 120 lb (54.432 kg)  BMI 22.69 kg/m2  SpO2 100%  LMP 11/10/2015 Physical Exam  Constitutional: She is oriented to person, place, and time. She appears well-developed and well-nourished. No distress.  HENT:  Head: Normocephalic and atraumatic.  Oropharynx was erythematous.  No edema or exudate.   Eyes: Conjunctivae and EOM are normal.  Neck: Neck supple. No tracheal deviation present.  Cardiovascular: Normal rate.   Pulmonary/Chest: Effort normal. No respiratory distress.  Coarse lung sounds bilaterally.  No wheezing or rales.    Musculoskeletal: Normal range of motion.  Neurological: She is alert and oriented to person, place, and time.  Skin: Skin is warm and dry.  Psychiatric: She has a normal mood and affect.  Her behavior is normal.  Nursing note and vitals reviewed.   ED Course  Procedures (including critical care time)  DIAGNOSTIC STUDIES: Oxygen Saturation is 100% on RA, normal by my interpretation.    COORDINATION OF CARE:  5:47 PM Awaiting results of CXR.  Patient acknowledges and agrees with plan.    Labs Review Labs Reviewed - No data to display  Imaging Review Dg Chest 2 View  12/06/2015  CLINICAL DATA:  Cough and fever 4 days EXAM: CHEST  2 VIEW  COMPARISON:  09/13/2009 FINDINGS: The heart size and mediastinal contours are within normal limits. Both lungs are clear. The visualized skeletal structures are unremarkable. IMPRESSION: No active cardiopulmonary disease. Electronically Signed   By: Kathreen Devoid   On: 12/06/2015 17:48    I have personally reviewed and evaluated these images and lab results as part of my medical decision-making.   EKG Interpretation None      MDM   Final diagnoses:  Cough  URI (upper respiratory infection)    Pt is well appearing.  Non-toxic.  Vitals stable.  CXR reassuring.  Albuterol MDI dispensed, rx for prednisone. Sx's likely viral  I personally performed the services described in this documentation, which was scribed in my presence. The recorded information has been reviewed and is accurate.'   Kem Parkinson, PA-C 12/08/15 2214  Julianne Rice, MD 12/12/15 2246

## 2016-02-28 IMAGING — DX DG CHEST 2V
2 series · 2 of 2 positions shown · non-contrast
Comparison: 09/13/2009

CLINICAL DATA: Cough and fever 4 days

EXAM:
CHEST  2 VIEW

[chest pa]
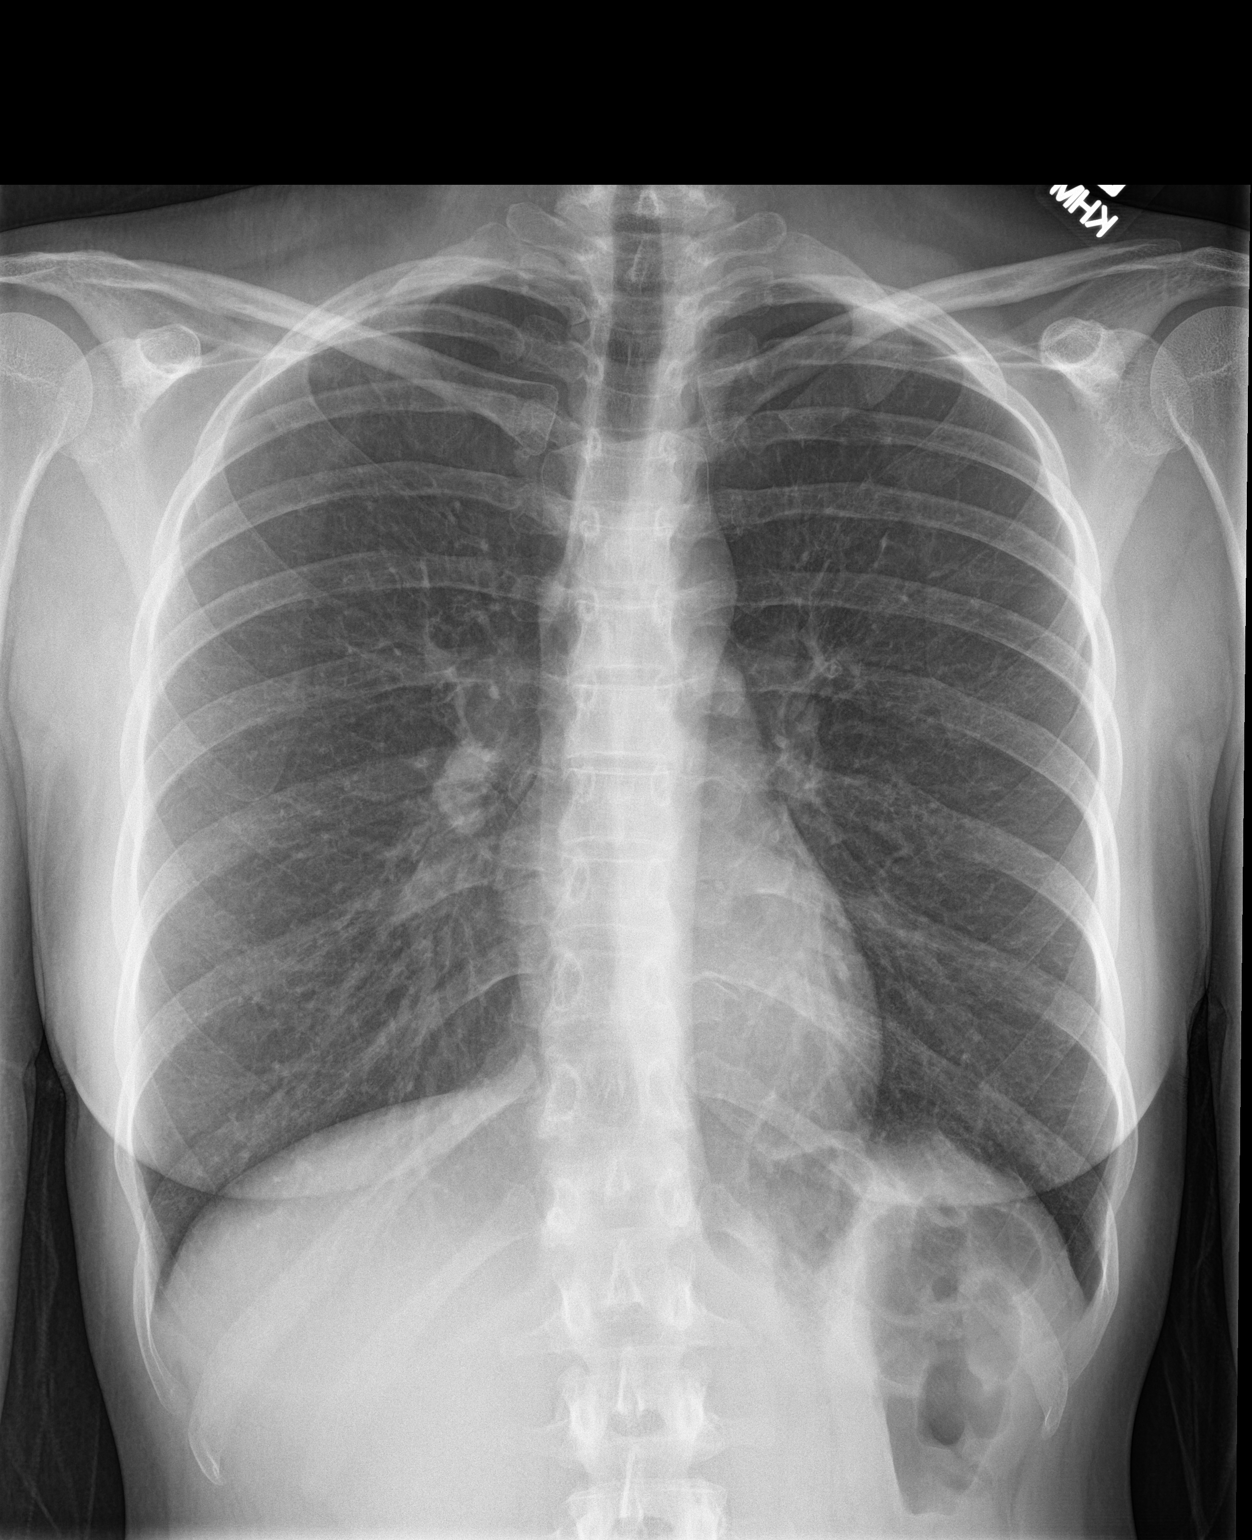

[chest lat]
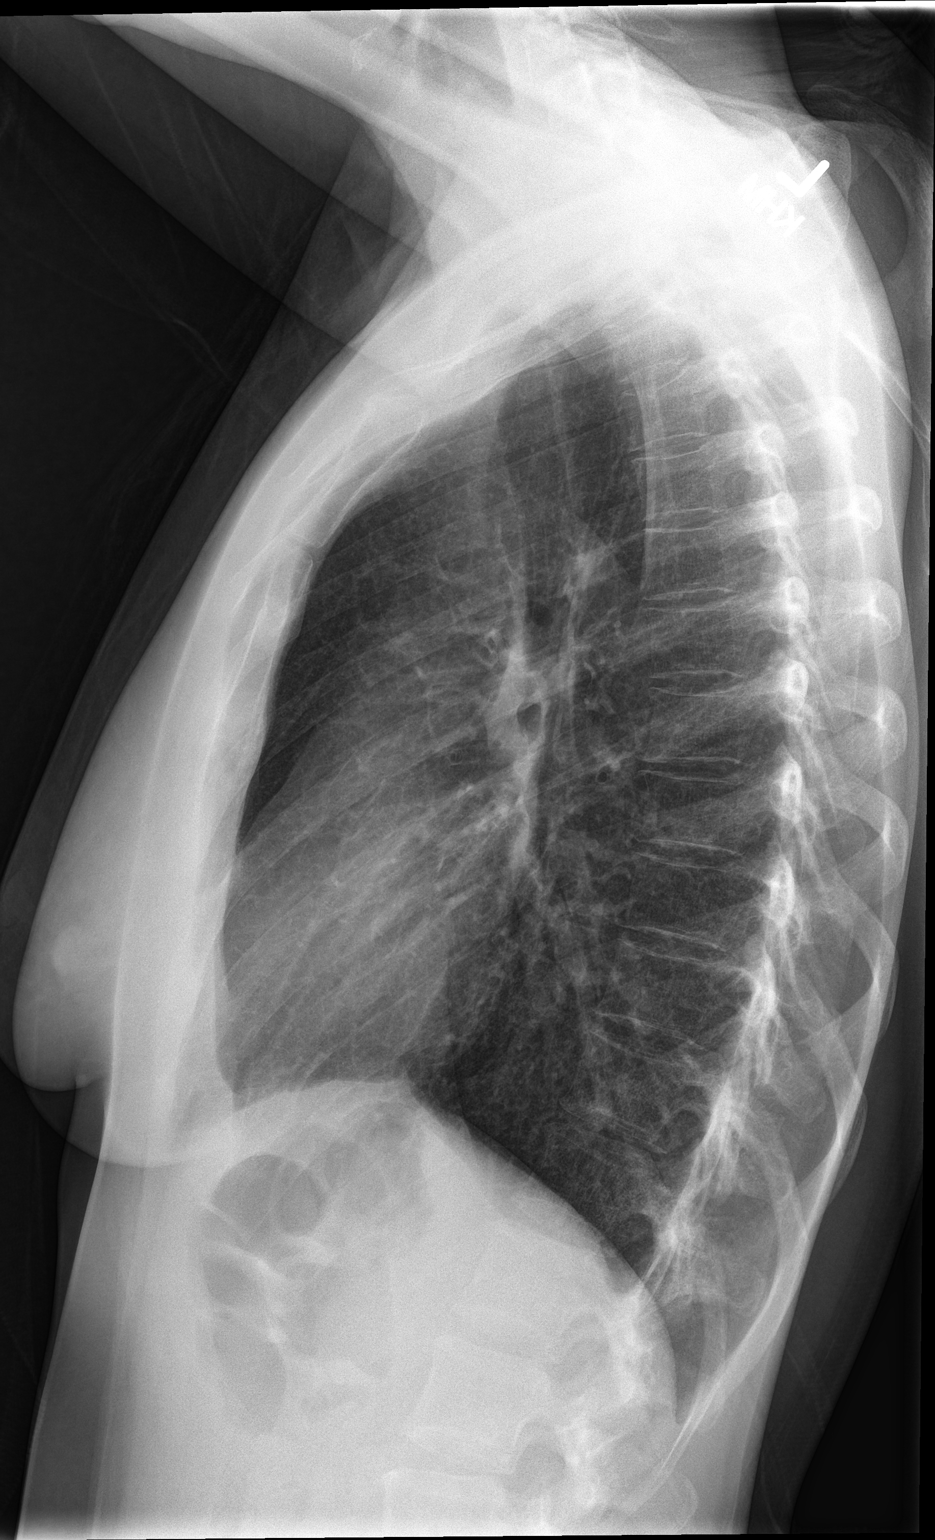

[2 of 2 positions shown; findings below may reference images not displayed]

FINDINGS: The heart size and mediastinal contours are within normal limits.
Both lungs are clear. The visualized skeletal structures are
unremarkable.
IMPRESSION: No active cardiopulmonary disease.

## 2019-09-29 ENCOUNTER — Encounter (HOSPITAL_COMMUNITY): Payer: Self-pay

## 2019-09-29 ENCOUNTER — Other Ambulatory Visit: Payer: Self-pay

## 2019-09-29 ENCOUNTER — Emergency Department (HOSPITAL_COMMUNITY)
Admission: EM | Admit: 2019-09-29 | Discharge: 2019-09-29 | Disposition: A | Discharge status: Home / Self Care | Payer: Medicaid Other | Attending: Emergency Medicine | Admitting: Emergency Medicine

## 2019-09-29 DIAGNOSIS — F17210 Nicotine dependence, cigarettes, uncomplicated: Secondary | ICD-10-CM | POA: Insufficient documentation

## 2019-09-29 DIAGNOSIS — N760 Acute vaginitis: Secondary | ICD-10-CM | POA: Insufficient documentation

## 2019-09-29 DIAGNOSIS — Z8541 Personal history of malignant neoplasm of cervix uteri: Secondary | ICD-10-CM | POA: Insufficient documentation

## 2019-09-29 DIAGNOSIS — N739 Female pelvic inflammatory disease, unspecified: Secondary | ICD-10-CM | POA: Insufficient documentation

## 2019-09-29 DIAGNOSIS — R102 Pelvic and perineal pain: Secondary | ICD-10-CM

## 2019-09-29 DIAGNOSIS — B9689 Other specified bacterial agents as the cause of diseases classified elsewhere: Secondary | ICD-10-CM

## 2019-09-29 DIAGNOSIS — N730 Acute parametritis and pelvic cellulitis: Secondary | ICD-10-CM

## 2019-09-29 LAB — URINALYSIS, ROUTINE W REFLEX MICROSCOPIC
Bilirubin Urine: NEGATIVE
Glucose, UA: NEGATIVE mg/dL
Hgb urine dipstick: NEGATIVE
Ketones, ur: NEGATIVE mg/dL
Leukocytes,Ua: NEGATIVE
Nitrite: NEGATIVE
Protein, ur: NEGATIVE mg/dL
Specific Gravity, Urine: 1.013 (ref 1.005–1.030)
pH: 8 (ref 5.0–8.0)

## 2019-09-29 LAB — WET PREP, GENITAL
Sperm: NONE SEEN
Trich, Wet Prep: NONE SEEN
Yeast Wet Prep HPF POC: NONE SEEN

## 2019-09-29 LAB — PREGNANCY, URINE: Preg Test, Ur: NEGATIVE

## 2019-09-29 MED ORDER — NAPROXEN 500 MG PO TABS: 500.0000 mg | ORAL_TABLET | Freq: Two times a day (BID) | ORAL | 0 refills | Status: AC

## 2019-09-29 MED ORDER — KETOROLAC TROMETHAMINE 60 MG/2ML IM SOLN
30.0000 mg | Freq: Once | INTRAMUSCULAR | Status: AC
  Administered 2019-09-29: 22:00:00 30 mg via INTRAMUSCULAR
  Filled 2019-09-29: qty 2

## 2019-09-29 MED ORDER — DOXYCYCLINE HYCLATE 100 MG PO CAPS: 100.0000 mg | ORAL_CAPSULE | Freq: Two times a day (BID) | ORAL | 0 refills | Status: AC

## 2019-09-29 MED ORDER — LIDOCAINE HCL (PF) 1 % IJ SOLN
INTRAMUSCULAR | Status: AC
  Administered 2019-09-29: 22:00:00 0.9 mL
  Filled 2019-09-29: qty 2

## 2019-09-29 MED ORDER — METRONIDAZOLE 500 MG PO TABS: 500.0000 mg | ORAL_TABLET | Freq: Two times a day (BID) | ORAL | 0 refills | Status: AC

## 2019-09-29 MED ORDER — ONDANSETRON 4 MG PO TBDP: 4.0000 mg | ORAL_TABLET | Freq: Three times a day (TID) | ORAL | 0 refills | Status: AC | PRN

## 2019-09-29 MED ORDER — CEFTRIAXONE SODIUM 250 MG IJ SOLR
250.0000 mg | Freq: Once | INTRAMUSCULAR | Status: AC
  Administered 2019-09-29: 22:00:00 250 mg via INTRAMUSCULAR
  Filled 2019-09-29: qty 250

## 2019-09-29 NOTE — Discharge Instructions (Addendum)
You were seen in the emergency department today for pelvic pain.  Your wet prep showed bacterial vaginosis, please see attached handout.  Your urine did not show an infection.  Your pregnancy test was negative.  We are treating you for pelvic inflammatory disease with antibiotics including doxycycline and Flagyl. Do not drink alcohol when taking flagyl as it can be very dangerous.  We are sending you home with naproxen help with pain, please take this every 12 hours as needed, be sure to take this with food as it can cause stomach upset and it or stomach bleeding.  Do not take other anti-inflammatories such as ibuprofen, Motrin, Aleve, Advil, Goody powders, meloxicam, or other similar medicines with naproxen.  You may take Tylenol with this medicine.  We are also sending home with Zofran to take every 8 hours as needed for nausea and vomiting.  We have prescribed you new medication(s) today. Discuss the medications prescribed today with your pharmacist as they can have adverse effects and interactions with your other medicines including over the counter and prescribed medications. Seek medical evaluation if you start to experience new or abnormal symptoms after taking one of these medicines, seek care immediately if you start to experience difficulty breathing, feeling of your throat closing, facial swelling, or rash as these could be indications of a more serious allergic reaction  Do not participate in intercourse until least 1 week after completion of your antibiotics.  Should any of your STD tests come back positive we will call you, if positive you will need to inform all sexual partners.  Please follow-up with women's health within 5 days for reevaluation.  Return to the ER for new or worsening symptoms including but not limited to fever, worsening pain, passing out, or any other concerns.

## 2019-09-29 NOTE — ED Triage Notes (Signed)
Pt presents to ED with complaints of lower pelvic pain x 3 days. Pt states it hurts to urinate.

## 2019-09-29 NOTE — ED Provider Notes (Signed)
[facility] EMERGENCY DEPARTMENT Provider Note   CSN: [id number] Arrival date & time: 09/29/19  1715     History   Chief Complaint Chief Complaint  Patient presents with  . Pelvic Pain    HPI Wendy Mathews is a 38 y.o. female with a history of tobacco abuse who presents to the emergency department with complaints of pelvic pain for the past few days.  Patient states the pain started is intermittent now is more constant, it is stabbing in nature to the bilateral pelvic region.  No alleviating or aggravating factors, tried ibuprofen without much relief.  She reports associated nausea, dysuria, urgency, & white vaginal discharge.  She states her symptoms feel similar to prior pelvic inflammatory disease which improved with antibiotics.  Her last menstrual period was 09/09/2019.  She is currently sexually active in a monogamous relationship, does not use protection.  Denies fever, chills, vomiting, diarrhea, melena, or vaginal bleeding.     HPI  Past Medical History:  Diagnosis Date  . Cancer (HCC)    cervical cancer  . Substance abuse ([place])     There are no active problems to display for this patient.   Past Surgical History:  Procedure Laterality Date  . conilization       OB History    Gravida  6   Para  6   Term  6   Preterm      AB      Living  6     SAB      TAB      Ectopic      Multiple      Live Births               Home Medications    Prior to Admission medications   Medication Sig Start Date End Date Taking? Authorizing Provider  albuterol (PROVENTIL HFA;VENTOLIN HFA) 108 (90 Base) MCG/ACT inhaler Inhale 2 puffs into the lungs 4 (four) times daily. 12/06/15   Triplett, Tammy, PA-C  gabapentin (NEURONTIN) 300 MG capsule Take 300 mg by mouth 3 (three) times daily.    [provider]  predniSONE (DELTASONE) 20 MG tablet Take 2 tablets (40 mg total) by mouth daily. For 5 days 12/06/15   Triplett, Tammy, PA-C   Pseudoeph-Doxylamine-DM-APAP (DAYQUIL/NYQUIL COLD/FLU RELIEF PO) Take 1-2 capsules by mouth every 4 (four) hours as needed (for cold and cough symptoms).    [provider]    Family History Family History  Problem Relation Age of Onset  . Diabetes Other     Social History Social History   Tobacco Use  . Smoking status: Current Every Day Smoker    Packs/day: 0.50    Years: 13.00    Pack years: 6.50    Types: Cigarettes  . Smokeless tobacco: Never Used  Substance Use Topics  . Alcohol use: No  . Drug use: Not Currently    Types: Marijuana    Comment: 3 months ago (adderal)      Allergies   Aspirin   Review of Systems Review of Systems  Constitutional: Negative for chills and fever.  Respiratory: Negative for shortness of breath.   Cardiovascular: Negative for chest pain.  Gastrointestinal: Positive for nausea. Negative for diarrhea and vomiting.  Genitourinary: Positive for dysuria, pelvic pain, urgency and vaginal discharge. Negative for vaginal bleeding.  All other systems reviewed and are negative.    Physical Exam Updated Vital Signs BP 134/80 (BP Location: Right Arm)   Pulse 60  Temp 98.3 F (36.8 C) (Oral)   Resp 18   Ht 5' (1.524 m)   Wt 56.7 kg   LMP 09/09/2019   SpO2 100%   BMI 24.41 kg/m   Physical Exam Vitals signs and nursing note reviewed. Exam conducted with a chaperone present.  Constitutional:      General: She is not in acute distress.    Appearance: She is well-developed. She is not toxic-appearing.  HENT:     Head: Normocephalic and atraumatic.  Eyes:     General:        Right eye: No discharge.        Left eye: No discharge.     Conjunctiva/sclera: Conjunctivae normal.  Neck:     Musculoskeletal: Neck supple.  Cardiovascular:     Rate and Rhythm: Normal rate and regular rhythm.  Pulmonary:     Effort: Pulmonary effort is normal. No respiratory distress.     Breath sounds: Normal breath sounds. No wheezing, rhonchi  or rales.  Abdominal:     General: There is no distension.     Palpations: Abdomen is soft.     Tenderness: There is abdominal tenderness in the suprapubic area. There is no right CVA tenderness or left CVA tenderness. Negative signs include McBurney's sign.  Genitourinary:    Labia:        Right: No lesion.        Left: No lesion.      Vagina: Injury: thick white moderate amount. Vaginal discharge present.     Cervix: No friability.     Adnexa:        Right: Tenderness present. No mass or fullness.         Left: Tenderness present. No mass or fullness.       Comments: Patient diffusely tender throughout bimanual exam. Skin:    General: Skin is warm and dry.     Findings: No rash.  Neurological:     Mental Status: She is alert.     Comments: Clear speech.   Psychiatric:        Behavior: Behavior normal.    ED Treatments / Results  Labs (all labs ordered are listed, but only abnormal results are displayed) Labs Reviewed  WET PREP, GENITAL - Abnormal; Notable for the following components:      Result Value   Clue Cells Wet Prep HPF POC PRESENT (*)    WBC, Wet Prep HPF POC FEW (*)    All other components within normal limits  URINALYSIS, ROUTINE W REFLEX MICROSCOPIC - Abnormal; Notable for the following components:   APPearance CLOUDY (*)    All other components within normal limits  URINE CULTURE  PREGNANCY, URINE  RPR  HIV ANTIBODY (ROUTINE TESTING W REFLEX)  GC/CHLAMYDIA PROBE AMP ([place]) NOT AT [facility]    EKG None  Radiology No results found.  Procedures Procedures (including critical care time)  Medications Ordered in ED Medications  ketorolac (TORADOL) injection 30 mg (has no administration in time range)  cefTRIAXone (ROCEPHIN) injection 250 mg (has no administration in time range)     Initial Impression / Assessment and Plan / ED Course  I have reviewed the triage vital signs and the nursing notes.  Pertinent labs & imaging results that were  available during my care of the patient were reviewed by me and considered in my medical decision making (see chart for details).   Patient presents to the emergency department with complaints of pelvic pain with associated  dysuria and vaginal discharge.  She is nontoxic-appearing, resting comfortably, vitals WNL.  On exam she has suprapubic tenderness and diffuse tenderness throughout bimanual exam with thick white vaginal discharge.  No peritoneal signs on abdominal exam.  Pregnancy test is negative therefore doubt IUP or ectopic.  Urinalysis without signs of infection to suggest UTI or pyelonephritis.  There is no hematuria, pain is bilateral, kidney stone seems less likely.  Also with bilateral pain doubt torsion.  Wet prep with findings of bacterial vaginosis.  Gonorrhea, chlamydia, HIV, and syphilis tests are pending.  Patient has a history of prior PID and states this feels similarly, there is a suspicion for this given her discharge and discomfort on bimanual.  Will treat with ceftriaxone in the emergency department and antibiotics including doxycycline and Flagyl to go home with.  We will also provide naproxen (patient has hx of allergy to ASA but reports tolerating ibuprofen w/o difficulty) and Zofran.  Discussed need to abstain from intercourse and to inform all social partners of positive.  I discussed results, treatment plan, need for follow-up, and return precautions with the patient. Provided opportunity for questions, patient confirmed understanding and is in agreement with plan.    Final Clinical Impressions(s) / ED Diagnoses   Final diagnoses:  Pelvic pain in female  Bacterial vaginosis  PID (acute pelvic inflammatory disease)    ED Discharge Orders         Ordered    metroNIDAZOLE (FLAGYL) 500 MG tablet  2 times daily     09/29/19 2142    doxycycline (VIBRAMYCIN) 100 MG capsule  2 times daily     09/29/19 2142    naproxen (NAPROSYN) 500 MG tablet  2 times daily     09/29/19  2142    ondansetron (ZOFRAN ODT) 4 MG disintegrating tablet  Every 8 hours PRN     09/29/19 2142           Amaryllis Dyke, PA-C 09/29/19 2143    Virgel Manifold, MD 09/29/19 2318

## 2019-09-30 LAB — HIV ANTIBODY (ROUTINE TESTING W REFLEX): HIV Screen 4th Generation wRfx: NONREACTIVE

## 2019-10-01 LAB — RPR: RPR Ser Ql: NONREACTIVE

## 2019-10-02 LAB — URINE CULTURE: Culture: >=100000 — AB

## 2019-10-03 ENCOUNTER — Telehealth: Payer: Self-pay | Admitting: *Deleted

## 2019-10-03 NOTE — Telephone Encounter (Signed)
Post ED Visit - Positive Culture Follow-up: Unsuccessful Patient Follow-up  Culture assessed and recommendations reviewed by:  []  Elenor Quinones, Pharm.D. []  Heide Guile, Pharm.D., BCPS AQ-ID []  Parks Neptune, Pharm.D., BCPS []  Alycia Rossetti, Pharm.D., BCPS []  Henderson, Pharm.D., BCPS, AAHIVP []  Legrand Como, Pharm.D., BCPS, AAHIVP []  Wynell Balloon, PharmD []  Vincenza Hews, PharmD, BCPS  Positive urine culture  []  Patient discharged without antimicrobial prescription and treatment is now indicated []  Organism is resistant to prescribed ED discharge antimicrobial []  Patient with positive blood cultures  Plan: if asymptomatic or improving continue current tx therapy.  If no better start Macrobid 100mg  PO BID x 5 days, Kennith Maes, PA  Unable to contact patient after 3 attempts, letter will be sent to address on file  Ardeen Fillers 10/03/2019, 9:40 AM

## 2019-10-03 NOTE — Progress Notes (Signed)
ED Antimicrobial Stewardship Positive Culture Follow Up   Wendy Mathews is an 38 y.o. female who presented to [facility] on 09/29/2019 with a chief complaint of  Chief Complaint  Patient presents with  . Pelvic Pain   Presenting with pelvic pain for 3 days - nausea, dysuria, urgency, and vaginal discharge. UA neg for nitrite and leukocytes. Received ceftriaxone 250 mg IM in ED.   Recent Results (from the past 720 hour(s))  Urine culture     Status: Abnormal   Collection Time: 09/29/19  6:04 PM   Specimen: Urine, Clean Catch  Result Value Ref Range Status   Specimen Description   Final    URINE, CLEAN CATCH Performed at [facility], [address], [place], [place] [address]    Special Requests   Final    NONE Performed at [facility], [address], [place], [place] [address]    Culture >=100,000 COLONIES/mL ESCHERICHIA COLI (A)  Final   Report Status 10/02/2019 FINAL  Final   Organism ID, Bacteria ESCHERICHIA COLI (A)  Final      Susceptibility   Escherichia coli - MIC*    AMPICILLIN >=32 RESISTANT Resistant     CEFAZOLIN <=4 SENSITIVE Sensitive     CEFTRIAXONE <=1 SENSITIVE Sensitive     CIPROFLOXACIN <=0.25 SENSITIVE Sensitive     GENTAMICIN <=1 SENSITIVE Sensitive     IMIPENEM <=0.25 SENSITIVE Sensitive     NITROFURANTOIN <=16 SENSITIVE Sensitive     TRIMETH/SULFA <=20 SENSITIVE Sensitive     AMPICILLIN/SULBACTAM >=32 RESISTANT Resistant     PIP/TAZO <=4 SENSITIVE Sensitive     Extended ESBL NEGATIVE Sensitive     * >=100,000 COLONIES/mL ESCHERICHIA COLI  Wet prep, genital     Status: Abnormal   Collection Time: 09/29/19  8:48 PM   Specimen: PATH Cytology Cervicovaginal Ancillary Only  Result Value Ref Range Status   Yeast Wet Prep HPF POC NONE SEEN NONE SEEN Final   Trich, Wet Prep NONE SEEN NONE SEEN Final   Clue Cells Wet Prep HPF POC PRESENT (A) NONE SEEN Final   WBC, Wet Prep HPF POC FEW (A) NONE SEEN Final   Sperm NONE SEEN  Final    Comment: Performed  at [facility], [address] [place], [place] [address]   Plan: Follow if symptoms improved. If improve, complete metronidazole and doxycycline course as directed. If still having issues, order Macrobid 100 mg twice daily for 5 days.   ED Provider: Kennith Maes, PA-C  Antonietta Jewel, PharmD, [place] Clinical Pharmacist  Phone: [phone]  Please check AMION for all [place] phone numbers After 10:00 PM, call [place] [phone] 10/03/2019, 9:30 AM Clinical Pharmacist Monday - Friday phone -  [phone] Saturday - Sunday phone - [phone]

## 2019-10-11 ENCOUNTER — Telehealth: Payer: Self-pay

## 2019-10-11 NOTE — Telephone Encounter (Signed)
  Returned call from [facility] re: symptom check for [facility] 09/29/2019  States no further problems
